# Patient Record
Sex: Female | Born: 1995 | Race: White | Hispanic: No | Marital: Single | Smoking: Never smoker
Health system: Southern US, Community
[De-identification: ages and names within clinical notes are randomized; demographics above are authoritative.]

---
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
## 2014-01-26 ENCOUNTER — Encounter: Payer: Self-pay | Admitting: Internal Medicine

## 2014-03-10 ENCOUNTER — Ambulatory Visit (INDEPENDENT_AMBULATORY_CARE_PROVIDER_SITE_OTHER): Payer: 59 | Admitting: Internal Medicine

## 2014-03-10 ENCOUNTER — Encounter: Payer: Self-pay | Admitting: Internal Medicine

## 2014-03-10 VITALS — BP 110/70 | HR 74 | Temp 98.4°F | Ht 61.0 in | Wt 112.5 lb

## 2014-03-10 DIAGNOSIS — N920 Excessive and frequent menstruation with regular cycle: Secondary | ICD-10-CM

## 2014-03-10 DIAGNOSIS — H9190 Unspecified hearing loss, unspecified ear: Secondary | ICD-10-CM

## 2014-03-10 DIAGNOSIS — Z0000 Encounter for general adult medical examination without abnormal findings: Secondary | ICD-10-CM

## 2014-03-10 LAB — POCT URINE PREGNANCY: Preg Test, Ur: NEGATIVE

## 2014-03-10 MED ORDER — LEVONORGEST-ETH ESTRAD 91-DAY 0.15-0.03 &0.01 MG PO TABS: 1.0000 | ORAL_TABLET | Freq: Every day | ORAL | Status: DC

## 2014-03-10 NOTE — Assessment & Plan Note (Signed)
General medical exam normal today including breast exam. PAP and pelvic deferred as pt asymptomatic, plan for first PAP at age 18. Labs today including CBC, CMP, lipids. Immunizations are UTD except flu vaccine which she will have this fall. Encouraged healthy diet and exercise.

## 2014-03-10 NOTE — Assessment & Plan Note (Signed)
Exam normal today, however pt notes hearing difficulty with some voices. Will set up audiology evaluation.

## 2014-03-10 NOTE — Assessment & Plan Note (Signed)
Discussed options to help with heavy menstrual bleeding. Will start OCP. Discussed potential risks and benefits with this. Follo wup prn.

## 2014-03-10 NOTE — Progress Notes (Signed)
Subjective:    Patient ID: Kathryn MoorsRenee Rhodes, female    DOB: 02/14/1996, 18 y.o.   MRN: [id number]  HPI 18YO female presents for annual exam.  Attended college last year, however decided not to go back this year. Taking time to reconsider major. Working - [employer] Exercising by walking, not as regularly as before. Diet - occasionally skips breakfast, regular diet with fruit and veggies  Hearing - sometimes has trouble understanding what people are saying Vision - wears contacts, sees Dr. Senaida Oresichardson  Menses - lately more painful, more cramping. Heavy bleeding compared to previous. No clotting noted. Period lasts 4-5 days. Uses tampon or pad more than every 2hr.   Review of Systems  Constitutional: Negative for fever, chills, appetite change, fatigue and unexpected weight change.  HENT: Positive for hearing loss. Negative for congestion and trouble swallowing.   Eyes: Negative for visual disturbance.  Respiratory: Negative for shortness of breath.   Cardiovascular: Negative for chest pain and leg swelling.  Gastrointestinal: Negative for nausea, vomiting, abdominal pain, diarrhea and constipation.  Genitourinary: Positive for menstrual problem. Negative for urgency, vaginal bleeding, vaginal discharge, vaginal pain and pelvic pain.  Musculoskeletal: Negative for arthralgias and myalgias.  Skin: Negative for color change and rash.  Hematological: Negative for adenopathy. Does not bruise/bleed easily.  Psychiatric/Behavioral: Negative for dysphoric mood. The patient is not nervous/anxious.        Objective:    BP 110/70  Pulse 74  Temp(Src) 98.4 F (36.9 C) (Oral)  Ht 5\' 1"  (1.549 m)  Wt 112 lb 8 oz (51.03 kg)  BMI 21.27 kg/m2  SpO2 99%  LMP 02/22/2014 Physical Exam  Constitutional: She is oriented to person, place, and time. She appears well-developed and well-nourished. No distress.  HENT:  Head: Normocephalic and atraumatic.  Right Ear: External ear normal.  Left Ear:  External ear normal.  Nose: Nose normal.  Mouth/Throat: Oropharynx is clear and moist. No oropharyngeal exudate.  Eyes: Conjunctivae are normal. Pupils are equal, round, and reactive to light. Right eye exhibits no discharge. Left eye exhibits no discharge. No scleral icterus.  Neck: Normal range of motion. Neck supple. No tracheal deviation present. No thyromegaly present.  Cardiovascular: Normal rate, regular rhythm, normal heart sounds and intact distal pulses.  Exam reveals no gallop and no friction rub.   No murmur heard. Pulmonary/Chest: Effort normal and breath sounds normal. No accessory muscle usage. Not tachypneic. No respiratory distress. She has no decreased breath sounds. She has no wheezes. She has no rales. She exhibits no tenderness. Right breast exhibits no inverted nipple, no mass, no nipple discharge, no skin change and no tenderness. Left breast exhibits no inverted nipple, no mass, no nipple discharge, no skin change and no tenderness. Breasts are symmetrical.  Abdominal: Soft. Bowel sounds are normal. She exhibits no distension and no mass. There is no tenderness. There is no rebound and no guarding.  Musculoskeletal: Normal range of motion. She exhibits no edema and no tenderness.  Lymphadenopathy:    She has no cervical adenopathy.  Neurological: She is alert and oriented to person, place, and time. No cranial nerve deficit. She exhibits normal muscle tone. Coordination normal.  Skin: Skin is warm and dry. No rash noted. She is not diaphoretic. No erythema. No pallor.  Psychiatric: She has a normal mood and affect. Her behavior is normal. Judgment and thought content normal.          Assessment & Plan:   Problem List Items Addressed This Visit  Unprioritized   Hearing decreased     Exam normal today, however pt notes hearing difficulty with some voices. Will set up audiology evaluation.    Relevant Orders      Ambulatory referral to Audiology   Menorrhagia  with regular cycle     Discussed options to help with heavy menstrual bleeding. Will start OCP. Discussed potential risks and benefits with this. Follo wup prn.    Relevant Medications      Levonorgestrel-Ethinyl Estradiol (AMETHIA,CAMRESE) 0.15-0.03 &0.01 MG tablet   Routine general medical examination at a health care facility - Primary     General medical exam normal today including breast exam. PAP and pelvic deferred as pt asymptomatic, plan for first PAP at age 71. Labs today including CBC, CMP, lipids. Immunizations are UTD except flu vaccine which she will have this fall. Encouraged healthy diet and exercise.    Relevant Orders      POCT urine pregnancy      CBC with Differential      Comprehensive metabolic panel      Lipid panel       Return in about 1 year (around 03/11/2015) for Physical.

## 2014-03-10 NOTE — Patient Instructions (Signed)

## 2014-03-11 LAB — COMPREHENSIVE METABOLIC PANEL
ALBUMIN: 4.4 g/dL (ref 3.5–5.2)
ALT: 11 U/L (ref 0–35)
AST: 24 U/L (ref 0–37)
Alkaline Phosphatase: 80 U/L (ref 47–119)
BUN: 9 mg/dL (ref 6–23)
CALCIUM: 9.5 mg/dL (ref 8.4–10.5)
CHLORIDE: 104 meq/L (ref 96–112)
CO2: 27 mEq/L (ref 19–32)
Creatinine, Ser: 0.7 mg/dL (ref 0.4–1.2)
GFR: 124.74 mL/min (ref >60.00)
Glucose, Bld: 69 mg/dL — ABNORMAL LOW (ref 70–99)
POTASSIUM: 4.1 meq/L (ref 3.5–5.1)
Sodium: 138 mEq/L (ref 135–145)
TOTAL PROTEIN: 7 g/dL (ref 6.0–8.3)
Total Bilirubin: 0.5 mg/dL (ref 0.2–1.2)

## 2014-03-11 LAB — LIPID PANEL
CHOL/HDL RATIO: 3
Cholesterol: 159 mg/dL (ref 0–200)
HDL: 61.9 mg/dL (ref >39.00)
LDL Cholesterol: 84 mg/dL (ref 0–99)
NONHDL: 97.1
Triglycerides: 66 mg/dL (ref 0.0–149.0)
VLDL: 13.2 mg/dL (ref 0.0–40.0)

## 2014-03-11 LAB — CBC WITH DIFFERENTIAL/PLATELET
BASOS ABS: 0 10*3/uL (ref 0.0–0.1)
BASOS PCT: 0.3 % (ref 0.0–3.0)
EOS ABS: 0.1 10*3/uL (ref 0.0–0.7)
Eosinophils Relative: 1.6 % (ref 0.0–5.0)
HCT: 39 % (ref 36.0–49.0)
Hemoglobin: 13.2 g/dL (ref 12.0–16.0)
LYMPHS PCT: 33.1 % (ref 24.0–48.0)
Lymphs Abs: 1.6 10*3/uL (ref 0.7–4.0)
MCHC: 33.7 g/dL (ref 31.0–37.0)
MCV: 98 fl (ref 78.0–98.0)
Monocytes Absolute: 0.4 10*3/uL (ref 0.1–1.0)
Monocytes Relative: 7.2 % (ref 3.0–12.0)
NEUTROS PCT: 57.8 % (ref 43.0–71.0)
Neutro Abs: 2.8 10*3/uL (ref 1.4–7.7)
Platelets: 289 10*3/uL (ref 150.0–575.0)
RBC: 3.98 Mil/uL (ref 3.80–5.70)
RDW: 12.4 % (ref 11.4–15.5)
WBC: 4.9 10*3/uL (ref 4.5–13.5)

## 2014-03-13 ENCOUNTER — Encounter: Payer: Self-pay | Admitting: *Deleted

## 2014-09-04 ENCOUNTER — Encounter (INDEPENDENT_AMBULATORY_CARE_PROVIDER_SITE_OTHER): Payer: Self-pay

## 2014-09-04 ENCOUNTER — Encounter: Payer: Self-pay | Admitting: Nurse Practitioner

## 2014-09-04 ENCOUNTER — Ambulatory Visit (INDEPENDENT_AMBULATORY_CARE_PROVIDER_SITE_OTHER): Payer: Managed Care, Other (non HMO) | Admitting: Nurse Practitioner

## 2014-09-04 VITALS — BP 108/78 | HR 77 | Temp 98.1°F | Resp 12 | Ht 61.0 in | Wt 112.8 lb

## 2014-09-04 DIAGNOSIS — K219 Gastro-esophageal reflux disease without esophagitis: Secondary | ICD-10-CM | POA: Insufficient documentation

## 2014-09-04 DIAGNOSIS — N920 Excessive and frequent menstruation with regular cycle: Secondary | ICD-10-CM

## 2014-09-04 MED ORDER — OMEPRAZOLE 20 MG PO CPDR: 20.0000 mg | DELAYED_RELEASE_CAPSULE | Freq: Every day | ORAL | Status: DC

## 2014-09-04 MED ORDER — LEVONORGEST-ETH ESTRAD 91-DAY 0.15-0.03 &0.01 MG PO TABS: 1.0000 | ORAL_TABLET | Freq: Every day | ORAL | Status: DC

## 2014-09-04 NOTE — Progress Notes (Signed)
Pre visit review using our clinic review tool, if applicable. No additional management support is needed unless otherwise documented below in the visit note. 

## 2014-09-04 NOTE — Patient Instructions (Addendum)
Omeprazole; Sodium Bicarbonate oral capsule What is this medicine? OMEPRAZOLE; SODIUM BICARBONATE (oh ME pray zol; SOE dee um bye KAR bon ate) prevents the production of acid in the stomach. It is used to treat gastroesophageal reflux disease (GERD), ulcers, and inflammation of the esophagus. This medicine may be used for other purposes; ask your health care provider or pharmacist if you have questions. COMMON BRAND NAME(S): Zegerid What should I tell my health care provider before I take this medicine? They need to know if you have any of these conditions: -Bartter's syndrome -kidney disease -history of low levels of calcium, magnesium, or potassium in the blood -low salt diet -metabolic alkalosis -an unusual reaction to omeprazole, sodium bicarbonate, other medicines, foods, dyes, or preservatives -pregnant or trying to get pregnant -breast-feeding How should I use this medicine? Take this medicine by mouth with a glass of water. Do not take with other drinks or foods. Follow the directions on the prescription label. Do not cut, crush or chew the capsules. Take this medicine on an empty stomach, at least 1 hour before a meal. Take your medicine at regular intervals. Do not take your medicine more often than directed. Do not stop taking except on your doctor's advice. Talk to your pediatrician regarding the use of this medicine in children. Special care may be needed. Overdosage: If you think you have taken too much of this medicine contact a poison control center or emergency room at once. NOTE: This medicine is only for you. Do not share this medicine with others. What if I miss a dose? If you miss a dose, take it as soon as you can. If it is almost time for your next dose, take only that dose. Do not take double or extra doses. What may interact with this medicine? Do not take this medicine with any of the following medications: -atazanavir -clopidogrel -nelfinavir This medicine may  also interact with the following medications: -antibiotics like ampicillin, clarithromycin, tetracycline -antifungals like itraconazole, ketoconazole, and voriconazole -antivirals like delavirdine, efavirenz, indinavir, saquinavir -certain medicines that treat or prevent blood clots like warfarin -cyclosporine -dasatinib -digoxin -disulfiram -diuretics -iron supplements -medicines for anxiety, panic, and sleep like diazepam -medicines for seizures like carbamazepine, phenobarbital, phenytoin -other medicines for stomach acid -tacrolimus -tolmetin -vitamin B12 This list may not describe all possible interactions. Give your health care provider a list of all the medicines, herbs, non-prescription drugs, or dietary supplements you use. Also tell them if you smoke, drink alcohol, or use illegal drugs. Some items may interact with your medicine. What should I watch for while using this medicine? Visit your doctor for regular check ups. Tell your doctor if your symptoms do not get better or if they get worse. Do not treat diarrhea with over the counter products. Contact your doctor if you have diarrhea that lasts more than 2 days or if it is severe and watery. You may need blood work done while you are taking this medicine. What side effects may I notice from receiving this medicine? Side effects that you should report to your doctor or health care professional as soon as possible: -allergic reactions like skin rash, itching or hives, swelling of the face, lips, or tongue -bone, muscle or joint pain -breathing problems -chest pain -dark urine -diarrhea -dizziness -fast, irregular heartbeat -feeling faint or lightheaded, falls -fever or sore throat -high blood pressure -palpitations -muscle spasms -redness, blistering, peeling or loosening of the skin, including inside the mouth -seizures -stomach pain -swelling of the ankles,  legs -tremors -trouble passing urine or change in the  amount of urine -unusual bleeding, bruising -unusually weak or tired -yellowing of the eyes or skin Side effects that usually do not require medical attention (report to your doctor or health care professional if they continue or are bothersome): -constipation -dry mouth -headache -loose stools -nausea This list may not describe all possible side effects. Call your doctor for medical advice about side effects. You may report side effects to FDA at 1-800-FDA-1088. Where should I keep my medicine? Keep out of the reach of children. Store at room temperature between 15 and 30 degrees C (59 and 86 degrees F). Protect from moisture. Throw away any unused medicine after the expiration date. NOTE: This sheet is a summary. It may not cover all possible information. If you have questions about this medicine, talk to your doctor, pharmacist, or health care provider.  2015, [employer]vier/[employer] Standard. (2011-04-14 14:37:02)

## 2014-09-04 NOTE — Assessment & Plan Note (Signed)
Pt stable and happy with OCP. Not sexually active and currently menstruating. Do not need POCT urine pregnancy today due to consistency and inactivity. Will refill. Sees Dr. Dan HumphreysWalker again in August.

## 2014-09-04 NOTE — Assessment & Plan Note (Signed)
Suspect GERD. Worsening. Dyspepsia can occur with Prozac. Follow up if worsening/failure to improve.

## 2014-09-04 NOTE — Progress Notes (Signed)
   Subjective:    Patient ID: Kathryn MoorsRenee Rhodes, female    DOB: 04/21/1996, 19 y.o.   MRN: [id number]  HPI  Kathryn Rhodes is a 19 yo female with a CC of burning when eating and drinking.  1) After eating breakfast this morning felt pain/burning. Felt it before a few times and it was more discomfort than pain. Pt points to epigastric area. Lasted for about 2 hours and resolved gradually. Drinks tea and there was no pain after this today.   No spicy foods, no smoking, drinks tea caffeine- hot tea, likes chocolate, no recent NSAID use, no use of peppermint recently.   Changed medications recently added trazadone and prozac about 4 weeks ago from Dr. Maryruth BunKapur. Has not had the adderall yet  2) LMP- 08/01/14 lasts about 3-4 days, OCP no side effects reported, takes every day about the same time, Not currently sexually active.   Review of Systems  Constitutional: Negative for fever, chills, diaphoresis and fatigue.  Respiratory: Negative for chest tightness, shortness of breath and wheezing.   Cardiovascular: Negative for chest pain, palpitations and leg swelling.  Gastrointestinal: Positive for abdominal pain. Negative for nausea, vomiting, diarrhea and abdominal distention.       Epigastric burning after breakfast this morning  Genitourinary: Negative for menstrual problem.       Menstrual issue controlled with current OCP  Skin: Negative for rash.  Neurological: Negative for dizziness, weakness, numbness and headaches.  Psychiatric/Behavioral: The patient is not nervous/anxious.        Objective:   Physical Exam  Constitutional: She is oriented to person, place, and time. She appears well-developed and well-nourished. No distress.  BP 108/78 mmHg  Pulse 77  Temp(Src) 98.1 F (36.7 C) (Oral)  Resp 12  Ht 5\' 1"  (1.549 m)  Wt 112 lb 12.8 oz (51.166 kg)  BMI 21.32 kg/m2  SpO2 98%  LMP  (Approximate)   HENT:  Head: Normocephalic and atraumatic.  Right Ear: External ear normal.  Left Ear:  External ear normal.  Cardiovascular: Normal rate, regular rhythm, normal heart sounds and intact distal pulses.  Exam reveals no gallop and no friction rub.   No murmur heard. Pulmonary/Chest: Effort normal and breath sounds normal. No respiratory distress. She has no wheezes. She has no rales. She exhibits no tenderness.  Abdominal: Soft. Bowel sounds are normal. She exhibits no distension and no mass. There is no tenderness. There is no rebound and no guarding.  Neurological: She is alert and oriented to person, place, and time. No cranial nerve deficit. She exhibits normal muscle tone. Coordination normal.  Skin: Skin is warm and dry. No rash noted. She is not diaphoretic.  Psychiatric: She has a normal mood and affect. Her behavior is normal. Judgment and thought content normal.        Assessment & Plan:

## 2015-03-13 ENCOUNTER — Telehealth: Payer: Self-pay | Admitting: Internal Medicine

## 2015-03-13 ENCOUNTER — Encounter: Payer: Self-pay | Admitting: Internal Medicine

## 2015-03-13 DIAGNOSIS — Z0289 Encounter for other administrative examinations: Secondary | ICD-10-CM

## 2015-03-13 NOTE — Telephone Encounter (Signed)
Pt missed appt for this morning. Pt missed appt due to class. I confirmed appt with pt yesterday but wanted to come earlier no appt was avail, pt stated ok and hung up. Thank You!

## 2015-03-28 ENCOUNTER — Encounter: Payer: Self-pay | Admitting: Internal Medicine

## 2015-03-28 ENCOUNTER — Ambulatory Visit (INDEPENDENT_AMBULATORY_CARE_PROVIDER_SITE_OTHER): Payer: Managed Care, Other (non HMO) | Admitting: Internal Medicine

## 2015-03-28 VITALS — BP 100/70 | HR 102 | Temp 98.6°F | Ht 60.75 in | Wt 101.1 lb

## 2015-03-28 DIAGNOSIS — Z0000 Encounter for general adult medical examination without abnormal findings: Secondary | ICD-10-CM | POA: Diagnosis not present

## 2015-03-28 DIAGNOSIS — N920 Excessive and frequent menstruation with regular cycle: Secondary | ICD-10-CM

## 2015-03-28 MED ORDER — LEVONORGEST-ETH ESTRAD 91-DAY 0.15-0.03 &0.01 MG PO TABS: 1.0000 | ORAL_TABLET | Freq: Every day | ORAL | Status: DC

## 2015-03-28 NOTE — Progress Notes (Signed)
Subjective:    Patient ID: Kathryn Rhodes, female    DOB: 1995-08-19, 19 y.o.   MRN: [id number]  HPI  19YO female presents for physical exam.  Doing well. Menstrual cycles improved with OCP. Followed by Dr. Maryruth Bun and on Fluoxetine and Adderall. Also has counseling with Dr. Guss Bunde. Feels that medication has been helpful.  History reviewed. No pertinent past medical history. Family History  Problem Relation Age of Onset  . Mental retardation Father   . Cancer Paternal Aunt     lung, great aunt  . Arthritis Maternal Grandmother   . Heart disease Maternal Grandmother   . Diabetes Maternal Grandmother   . Arthritis Maternal Grandfather   . Heart disease Maternal Grandfather   . Diabetes Maternal Grandfather   . Arthritis Paternal Grandmother   . Heart disease Paternal Grandmother   . Diabetes Paternal Grandmother   . Arthritis Paternal Grandfather   . Heart disease Paternal Grandfather   . Diabetes Paternal Grandfather    History reviewed. No pertinent past surgical history. Social History   Social History  . Marital Status: Single    Spouse Name: N/A  . Number of Children: N/A  . Years of Education: N/A   Social History Main Topics  . Smoking status: Never Smoker   . Smokeless tobacco: Never Used  . Alcohol Use: No  . Drug Use: No  . Sexual Activity: Not Asked   Other Topics Concern  . None   Social History Narrative   Lives in [place] with family. Older brother. No pets.      Previously lived in [place], [place] and [place].      College- [employer]      Diet - regular      Exercise - walks daily 30-21min, bikes    Review of Systems  Constitutional: Negative for fever, chills, appetite change, fatigue and unexpected weight change.  Eyes: Negative for visual disturbance.  Respiratory: Negative for shortness of breath.   Cardiovascular: Negative for chest pain and leg swelling.  Gastrointestinal: Negative for nausea, vomiting, abdominal pain, diarrhea and  constipation.  Musculoskeletal: Negative for myalgias and arthralgias.  Skin: Negative for color change and rash.  Hematological: Negative for adenopathy. Does not bruise/bleed easily.  Psychiatric/Behavioral: Negative for sleep disturbance and dysphoric mood. The patient is not nervous/anxious.        Objective:    BP 100/70 mmHg  Pulse 102  Temp(Src) 98.6 F (37 C) (Oral)  Ht 5' 0.75" (1.543 m)  Wt 101 lb 2 oz (45.87 kg)  BMI 19.27 kg/m2  SpO2 99%  LMP 02/28/2015 (Approximate) Physical Exam  Constitutional: She is oriented to person, place, and time. She appears well-developed and well-nourished. No distress.  HENT:  Head: Normocephalic and atraumatic.  Right Ear: External ear normal.  Left Ear: External ear normal.  Nose: Nose normal.  Mouth/Throat: Oropharynx is clear and moist. No oropharyngeal exudate.  Eyes: Conjunctivae and EOM are normal. Pupils are equal, round, and reactive to light. Right eye exhibits no discharge. Left eye exhibits no discharge. No scleral icterus.  Neck: Normal range of motion. Neck supple. No tracheal deviation present. No thyromegaly present.  Cardiovascular: Normal rate, regular rhythm, normal heart sounds and intact distal pulses.  Exam reveals no gallop and no friction rub.   No murmur heard. Pulmonary/Chest: Effort normal and breath sounds normal. No respiratory distress. She has no wheezes. She has no rales. She exhibits no tenderness.  Abdominal: Soft. Bowel sounds are normal. She exhibits no distension  and no mass. There is no tenderness. There is no rebound and no guarding.  Musculoskeletal: Normal range of motion. She exhibits no edema or tenderness.  Lymphadenopathy:    She has no cervical adenopathy.  Neurological: She is alert and oriented to person, place, and time. No cranial nerve deficit. She exhibits normal muscle tone. Coordination normal.  Skin: Skin is warm and dry. No rash noted. She is not diaphoretic. No erythema. No pallor.   Psychiatric: She has a normal mood and affect. Her behavior is normal. Judgment and thought content normal.          Assessment & Plan:   Problem List Items Addressed This Visit      Unprioritized   Routine general medical examination at a health care facility - Primary    General medical exam normal today. Encouraged healthy diet and exercise. Discussed preventative health and safety measures, such as avoiding texting while driving, avoiding excessive alcohol consumption, barrier protection during intercourse. Labs were normal in 2015, and will wait to repeat until next year. Discussed urine screen for chlamydia if she becomes sexually active. PAP smear for cervical cancer at age 71. Flu vaccine today.       Other Visit Diagnoses    Menorrhagia with regular cycle        Relevant Medications    Levonorgestrel-Ethinyl Estradiol (AMETHIA,CAMRESE) 0.15-0.03 &0.01 MG tablet        Return in about 1 year (around 03/27/2016) for Physical.

## 2015-03-28 NOTE — Assessment & Plan Note (Signed)
General medical exam normal today. Encouraged healthy diet and exercise. Discussed preventative health and safety measures, such as avoiding texting while driving, avoiding excessive alcohol consumption, barrier protection during intercourse. Labs were normal in 2015, and will wait to repeat until next year. Discussed urine screen for chlamydia if she becomes sexually active. PAP smear for cervical cancer at age 19. Flu vaccine today.

## 2015-03-28 NOTE — Patient Instructions (Signed)

## 2015-03-28 NOTE — Progress Notes (Signed)
Pre visit review using our clinic review tool, if applicable. No additional management support is needed unless otherwise documented below in the visit note. 

## 2015-08-27 ENCOUNTER — Encounter: Payer: Self-pay | Admitting: Internal Medicine

## 2016-02-12 ENCOUNTER — Encounter: Payer: Managed Care, Other (non HMO) | Admitting: Family

## 2016-02-12 ENCOUNTER — Encounter: Payer: Self-pay | Admitting: Family

## 2016-02-12 NOTE — Progress Notes (Signed)
Pre visit review using our clinic review tool, if applicable. No additional management support is needed unless otherwise documented below in the visit note. 

## 2016-02-12 NOTE — Progress Notes (Signed)
   Subjective:    Patient ID: Kathryn Rhodes, female    DOB: 1996/05/08, 20 y.o.   MRN: [id number]  CC: [name] is a 20 y.o. female who presents today for physical exam and to establish care.    HPI: Patient here for physical exam. No complaints. Feeling well.    Follows with for ADHD.  Sexually active LMP:     HISTORY:  No past medical history on file.  No past surgical history on file. Family History  Problem Relation Age of Onset  . Mental retardation Father   . Cancer Paternal Aunt     lung, great aunt  . Arthritis Maternal Grandmother   . Heart disease Maternal Grandmother   . Diabetes Maternal Grandmother   . Arthritis Maternal Grandfather   . Heart disease Maternal Grandfather   . Diabetes Maternal Grandfather   . Arthritis Paternal Grandmother   . Heart disease Paternal Grandmother   . Diabetes Paternal Grandmother   . Arthritis Paternal Grandfather   . Heart disease Paternal Grandfather   . Diabetes Paternal Grandfather       ALLERGIES: Review of patient's allergies indicates no known allergies.  Current Outpatient Prescriptions on File Prior to Visit  Medication Sig Dispense Refill  . amphetamine-dextroamphetamine (ADDERALL) 5 MG tablet Take 5 mg by mouth 2 (two) times daily.    Marland Kitchen FLUoxetine (PROZAC) 10 MG capsule Take 30 mg by mouth daily.    . Levonorgestrel-Ethinyl Estradiol (AMETHIA,CAMRESE) 0.15-0.03 &0.01 MG tablet Take 1 tablet by mouth daily. 1 Package 4   No current facility-administered medications on file prior to visit.     Social History  Substance Use Topics  . Smoking status: Never Smoker  . Smokeless tobacco: Never Used  . Alcohol use No    Review of Systems    Objective:    There were no vitals taken for this visit.  BP Readings from Last 3 Encounters:  03/28/15 100/70  09/04/14 108/78  03/10/14 110/70   Wt Readings from Last 3 Encounters:  03/28/15 101 lb 2 oz (45.9 kg)  09/04/14 112 lb 12.8 oz (51.2 kg) (20 %, Z=  -0.82)*  03/10/14 112 lb 8 oz (51 kg) (21 %, Z= -0.79)*   * Growth percentiles are based on CDC 2-20 Years data.    Physical Exam     Assessment & Plan:   Problem List Items Addressed This Visit    None    Visit Diagnoses   None.      I am having Ms. Decker maintain her amphetamine-dextroamphetamine, FLUoxetine, and Levonorgestrel-Ethinyl Estradiol.   No orders of the defined types were placed in this encounter.   Return precautions given.   Risks, benefits, and alternatives of the medications and treatment plan prescribed today were discussed, and patient expressed understanding.   Education regarding symptom management and diagnosis given to patient on AVS.   Continue to follow with Wynona Dove, MD for routine health maintenance.   Kerby Moors and I agreed with plan.   Rennie Plowman, FNP     This encounter was created in error - please disregard.

## 2016-02-27 ENCOUNTER — Encounter: Payer: Self-pay | Admitting: Family

## 2016-02-27 ENCOUNTER — Ambulatory Visit (INDEPENDENT_AMBULATORY_CARE_PROVIDER_SITE_OTHER): Payer: Managed Care, Other (non HMO) | Admitting: Family

## 2016-02-27 VITALS — BP 114/64 | HR 104 | Temp 98.1°F | Ht 61.0 in | Wt 112.4 lb

## 2016-02-27 DIAGNOSIS — Z0000 Encounter for general adult medical examination without abnormal findings: Secondary | ICD-10-CM

## 2016-02-27 DIAGNOSIS — F32A Depression, unspecified: Secondary | ICD-10-CM | POA: Insufficient documentation

## 2016-02-27 DIAGNOSIS — F988 Other specified behavioral and emotional disorders with onset usually occurring in childhood and adolescence: Secondary | ICD-10-CM

## 2016-02-27 DIAGNOSIS — F329 Major depressive disorder, single episode, unspecified: Secondary | ICD-10-CM | POA: Diagnosis not present

## 2016-02-27 DIAGNOSIS — N920 Excessive and frequent menstruation with regular cycle: Secondary | ICD-10-CM

## 2016-02-27 DIAGNOSIS — F909 Attention-deficit hyperactivity disorder, unspecified type: Secondary | ICD-10-CM | POA: Diagnosis not present

## 2016-02-27 MED ORDER — LEVONORGEST-ETH ESTRAD 91-DAY 0.15-0.03 &0.01 MG PO TABS: 1.0000 | ORAL_TABLET | Freq: Every day | ORAL | 4 refills | Status: AC

## 2016-02-27 NOTE — Patient Instructions (Signed)
Pleasure meeting you.   Health Maintenance, Female Adopting a healthy lifestyle and getting preventive care can go a long way to promote health and wellness. Talk with your health care provider about what schedule of regular examinations is right for you. This is a good chance for you to check in with your provider about disease prevention and staying healthy. In between checkups, there are plenty of things you can do on your own. Experts have done a lot of research about which lifestyle changes and preventive measures are most likely to keep you healthy. Ask your health care provider for more information. WEIGHT AND DIET  Eat a healthy diet  Be sure to include plenty of vegetables, fruits, low-fat dairy products, and lean protein.  Do not eat a lot of foods high in solid fats, added sugars, or salt.  Get regular exercise. This is one of the most important things you can do for your health.  Most adults should exercise for at least 150 minutes each week. The exercise should increase your heart rate and make you sweat (moderate-intensity exercise).  Most adults should also do strengthening exercises at least twice a week. This is in addition to the moderate-intensity exercise.  Maintain a healthy weight  Body mass index (BMI) is a measurement that can be used to identify possible weight problems. It estimates body fat based on height and weight. Your health care provider can help determine your BMI and help you achieve or maintain a healthy weight.  For females 20 years of age and older:   A BMI below 18.5 is considered underweight.  A BMI of 18.5 to 24.9 is normal.  A BMI of 25 to 29.9 is considered overweight.  A BMI of 30 and above is considered obese.  Watch levels of cholesterol and blood lipids  You should start having your blood tested for lipids and cholesterol at 20 years of age, then have this test every 5 years.  You may need to have your cholesterol levels checked more  often if:  Your lipid or cholesterol levels are high.  You are older than 21 years of age.  You are at high risk for heart disease.  CANCER SCREENING   Lung Cancer  Lung cancer screening is recommended for adults 55-80 years old who are at high risk for lung cancer because of a history of smoking.  A yearly low-dose CT scan of the lungs is recommended for people who:  Currently smoke.  Have quit within the past 15 years.  Have at least a 30-pack-year history of smoking. A pack year is smoking an average of one pack of cigarettes a day for 1 year.  Yearly screening should continue until it has been 15 years since you quit.  Yearly screening should stop if you develop a health problem that would prevent you from having lung cancer treatment.  Breast Cancer  Practice breast self-awareness. This means understanding how your breasts normally appear and feel.  It also means doing regular breast self-exams. Let your health care provider know about any changes, no matter how small.  If you are in your 20s or 30s, you should have a clinical breast exam (CBE) by a health care provider every 1-3 years as part of a regular health exam.  If you are 40 or older, have a CBE every year. Also consider having a breast X-ray (mammogram) every year.  If you have a family history of breast cancer, talk to your health care provider about   genetic screening.  If you are at high risk for breast cancer, talk to your health care provider about having an MRI and a mammogram every year.  Breast cancer gene (BRCA) assessment is recommended for women who have family members with BRCA-related cancers. BRCA-related cancers include:  Breast.  Ovarian.  Tubal.  Peritoneal cancers.  Results of the assessment will determine the need for genetic counseling and BRCA1 and BRCA2 testing. Cervical Cancer Your health care provider may recommend that you be screened regularly for cancer of the pelvic organs  (ovaries, uterus, and vagina). This screening involves a pelvic examination, including checking for microscopic changes to the surface of your cervix (Pap test). You may be encouraged to have this screening done every 3 years, beginning at age 21.  For women ages 30-65, health care providers may recommend pelvic exams and Pap testing every 3 years, or they may recommend the Pap and pelvic exam, combined with testing for human papilloma virus (HPV), every 5 years. Some types of HPV increase your risk of cervical cancer. Testing for HPV may also be done on women of any age with unclear Pap test results.  Other health care providers may not recommend any screening for nonpregnant women who are considered low risk for pelvic cancer and who do not have symptoms. Ask your health care provider if a screening pelvic exam is right for you.  If you have had past treatment for cervical cancer or a condition that could lead to cancer, you need Pap tests and screening for cancer for at least 20 years after your treatment. If Pap tests have been discontinued, your risk factors (such as having a new sexual partner) need to be reassessed to determine if screening should resume. Some women have medical problems that increase the chance of getting cervical cancer. In these cases, your health care provider may recommend more frequent screening and Pap tests. Colorectal Cancer  This type of cancer can be detected and often prevented.  Routine colorectal cancer screening usually begins at 21 years of age and continues through 21 years of age.  Your health care provider may recommend screening at an earlier age if you have risk factors for colon cancer.  Your health care provider may also recommend using home test kits to check for hidden blood in the stool.  A small camera at the end of a tube can be used to examine your colon directly (sigmoidoscopy or colonoscopy). This is done to check for the earliest forms of  colorectal cancer.  Routine screening usually begins at age 50.  Direct examination of the colon should be repeated every 5-10 years through 21 years of age. However, you may need to be screened more often if early forms of precancerous polyps or small growths are found. Skin Cancer  Check your skin from head to toe regularly.  Tell your health care provider about any new moles or changes in moles, especially if there is a change in a mole's shape or color.  Also tell your health care provider if you have a mole that is larger than the size of a pencil eraser.  Always use sunscreen. Apply sunscreen liberally and repeatedly throughout the day.  Protect yourself by wearing long sleeves, pants, a wide-brimmed hat, and sunglasses whenever you are outside. HEART DISEASE, DIABETES, AND HIGH BLOOD PRESSURE   High blood pressure causes heart disease and increases the risk of stroke. High blood pressure is more likely to develop in:  People who have blood   pressure in the high end of the normal range (130-139/85-89 mm Hg).  People who are overweight or obese.  People who are African American.  If you are 27-24 years of age, have your blood pressure checked every 3-5 years. If you are 57 years of age or older, have your blood pressure checked every year. You should have your blood pressure measured twice--once when you are at a hospital or clinic, and once when you are not at a hospital or clinic. Record the average of the two measurements. To check your blood pressure when you are not at a hospital or clinic, you can use:  An automated blood pressure machine at a pharmacy.  A home blood pressure monitor.  If you are between 45 years and 18 years old, ask your health care provider if you should take aspirin to prevent strokes.  Have regular diabetes screenings. This involves taking a blood sample to check your fasting blood sugar level.  If you are at a normal weight and have a low risk  for diabetes, have this test once every three years after 21 years of age.  If you are overweight and have a high risk for diabetes, consider being tested at a younger age or more often. PREVENTING INFECTION  Hepatitis B  If you have a higher risk for hepatitis B, you should be screened for this virus. You are considered at high risk for hepatitis B if:  You were born in a country where hepatitis B is common. Ask your health care provider which countries are considered high risk.  Your parents were born in a high-risk country, and you have not been immunized against hepatitis B (hepatitis B vaccine).  You have HIV or AIDS.  You use needles to inject street drugs.  You live with someone who has hepatitis B.  You have had sex with someone who has hepatitis B.  You get hemodialysis treatment.  You take certain medicines for conditions, including cancer, organ transplantation, and autoimmune conditions. Hepatitis C  Blood testing is recommended for:  Everyone born from [birth year] through 1966.  Anyone with known risk factors for hepatitis C. Sexually transmitted infections (STIs)  You should be screened for sexually transmitted infections (STIs) including gonorrhea and chlamydia if:  You are sexually active and are younger than 21 years of age.  You are older than 21 years of age and your health care provider tells you that you are at risk for this type of infection.  Your sexual activity has changed since you were last screened and you are at an increased risk for chlamydia or gonorrhea. Ask your health care provider if you are at risk.  If you do not have HIV, but are at risk, it may be recommended that you take a prescription medicine daily to prevent HIV infection. This is called pre-exposure prophylaxis (PrEP). You are considered at risk if:  You are sexually active and do not regularly use condoms or know the HIV status of your partner(s).  You take drugs by injection.  You  are sexually active with a partner who has HIV. Talk with your health care provider about whether you are at high risk of being infected with HIV. If you choose to begin PrEP, you should first be tested for HIV. You should then be tested every 3 months for as long as you are taking PrEP.  PREGNANCY   If you are premenopausal and you may become pregnant, ask your health care provider about preconception  counseling.  If you may become pregnant, take 400 to 800 micrograms (mcg) of folic acid every day.  If you want to prevent pregnancy, talk to your health care provider about birth control (contraception). OSTEOPOROSIS AND MENOPAUSE   Osteoporosis is a disease in which the bones lose minerals and strength with aging. This can result in serious bone fractures. Your risk for osteoporosis can be identified using a bone density scan.  If you are 65 years of age or older, or if you are at risk for osteoporosis and fractures, ask your health care provider if you should be screened.  Ask your health care provider whether you should take a calcium or vitamin D supplement to lower your risk for osteoporosis.  Menopause may have certain physical symptoms and risks.  Hormone replacement therapy may reduce some of these symptoms and risks. Talk to your health care provider about whether hormone replacement therapy is right for you.  HOME CARE INSTRUCTIONS   Schedule regular health, dental, and eye exams.  Stay current with your immunizations.   Do not use any tobacco products including cigarettes, chewing tobacco, or electronic cigarettes.  If you are pregnant, do not drink alcohol.  If you are breastfeeding, limit how much and how often you drink alcohol.  Limit alcohol intake to no more than 1 drink per day for nonpregnant women. One drink equals 12 ounces of beer, 5 ounces of wine, or 1 ounces of hard liquor.  Do not use street drugs.  Do not share needles.  Ask your health care provider for  help if you need support or information about quitting drugs.  Tell your health care provider if you often feel depressed.  Tell your health care provider if you have ever been abused or do not feel safe at home.   This information is not intended to replace advice given to you by your health care provider. Make sure you discuss any questions you have with your health care provider.   Document Released: 01/20/2011 Document Revised: 07/28/2014 Document Reviewed: 06/08/2013 Elsevier Interactive Patient Education 2016 [employer]  

## 2016-02-27 NOTE — Progress Notes (Signed)
Pre visit review using our clinic review tool, if applicable. No additional management support is needed unless otherwise documented below in the visit note. 

## 2016-02-27 NOTE — Assessment & Plan Note (Addendum)
Pap smear done today. Regular screening labs including HIV done today. Up-to-date on immunizations. Patient is not sexually active and currently using her birth control to regulate and manage menstrual cycles. Encouraged regular exercise.

## 2016-02-27 NOTE — Assessment & Plan Note (Signed)
Stable, well controlled on Adderall. Continues to follow with psychiatrist.

## 2016-02-27 NOTE — Addendum Note (Signed)
Addended by: Felix AhmadiFRANSEN, Danyela Posas A on: 02/27/2016 03:41 PM   Modules accepted: Orders

## 2016-02-27 NOTE — Progress Notes (Signed)
Subjective:    Patient ID: Kathryn MoorsRenee Pehrson, female    DOB: 04/24/1996, 20 y.o.   MRN: [id number]  CC: Kathryn MoorsRenee Thelin is a 20 y.o. female who presents today for physical exam.    HPI: Here to establish care and for CPE. Generally feeling well, no complaints.   ADD: Continues to follow Dr. Maryruth BunKapur for ADD, depression every 3 months. Formally diagnosed by Leavy CellaJulie Tabor. Takes Adderall.   Depression: Doing well. Has stopped Prozac. Seeing counselor regularly. No thoughts of hurting herself or anyone else.  Uses OCP to control painful periods. Helps with dysmenorrhea. LMP 7/1. No concern for STDs.      Cervical Cancer Screening: Do today.  Immunizations       Tetanus -UTD  HIV Screening- Candidate for; do today Labs: Screening labs today. Exercise: Gets regular exercise.  Alcohol use: None. Smoking/tobacco use: Nonsmoker.  Regular dental exams: UTD Wears seat belt: Yes.  HISTORY:  History reviewed. No pertinent past medical history.  History reviewed. No pertinent surgical history. Family History  Problem Relation Age of Onset  . Mental retardation Father   . Cancer Paternal Aunt     lung, great aunt  . Arthritis Maternal Grandmother   . Heart disease Maternal Grandmother   . Diabetes Maternal Grandmother   . Arthritis Maternal Grandfather   . Heart disease Maternal Grandfather   . Diabetes Maternal Grandfather   . Arthritis Paternal Grandmother   . Heart disease Paternal Grandmother   . Diabetes Paternal Grandmother   . Arthritis Paternal Grandfather   . Heart disease Paternal Grandfather   . Diabetes Paternal Grandfather       ALLERGIES: Review of patient's allergies indicates no known allergies.  Current Outpatient Prescriptions on File Prior to Visit  Medication Sig Dispense Refill  . amphetamine-dextroamphetamine (ADDERALL) 5 MG tablet Take 5 mg by mouth 2 (two) times daily.    Marland Kitchen. FLUoxetine (PROZAC) 10 MG capsule Take 30 mg by mouth daily.     No current  facility-administered medications on file prior to visit.     Social History  Substance Use Topics  . Smoking status: Never Smoker  . Smokeless tobacco: Never Used  . Alcohol use Yes    Review of Systems  Constitutional: Negative for chills, fever and unexpected weight change.  HENT: Negative for congestion.   Respiratory: Negative for cough.   Cardiovascular: Negative for chest pain, palpitations and leg swelling.  Gastrointestinal: Negative for nausea and vomiting.  Genitourinary: Negative for dysuria, genital sores, vaginal discharge and vaginal pain.  Musculoskeletal: Negative for arthralgias and myalgias.  Skin: Negative for rash.  Neurological: Negative for headaches.  Hematological: Negative for adenopathy.  Psychiatric/Behavioral: Negative for confusion and suicidal ideas.      Objective:    BP 114/64 (BP Location: Left Arm, Patient Position: Sitting, Cuff Size: Normal)   Pulse (!) 104   Temp 98.1 F (36.7 C) (Oral)   Ht 5\' 1"  (1.549 m)   Wt 112 lb 6.4 oz (51 kg)   LMP 01/19/2016   SpO2 99%   BMI 21.24 kg/m   BP Readings from Last 3 Encounters:  02/27/16 114/64  02/12/16 122/64  03/28/15 100/70   Wt Readings from Last 3 Encounters:  02/27/16 112 lb 6.4 oz (51 kg)  02/12/16 111 lb (50.3 kg)  03/28/15 101 lb 2 oz (45.9 kg)    Physical Exam  Constitutional: She appears well-developed and well-nourished.  Eyes: Conjunctivae are normal.  Neck: No thyroid mass and no thyromegaly  present.  Cardiovascular: Normal rate, regular rhythm, normal heart sounds and normal pulses.   Pulmonary/Chest: Effort normal and breath sounds normal. She has no wheezes. She has no rhonchi. She has no rales.  Neurological: She is alert.  Skin: Skin is warm and dry.  Psychiatric: She has a normal mood and affect. Her speech is normal and behavior is normal. Thought content normal.  Vitals reviewed.      Assessment & Plan:   Problem List Items Addressed This Visit      Other    Routine general medical examination at a health care facility - Primary    Pap smear done today. Regular screening labs including HIV done today. Up-to-date on immunizations. Encouraged safe sex and regular exercise.      Relevant Orders   CBC with Differential/Platelet   Comprehensive metabolic panel   Hemoglobin A1c   Lipid panel   TSH   VITAMIN D 25 Hydroxy (Vit-D Deficiency, Fractures)   Cytology - PAP   HIV antibody   ADD (attention deficit disorder)    Stable, well controlled on Adderall. Continues to follow with psychiatrist.      Depression    Well-controlled. Not taking any antidepressant at this time. Continues to see a Veterinary surgeon. Will follow.       Other Visit Diagnoses    Menorrhagia with regular cycle       Relevant Medications   Levonorgestrel-Ethinyl Estradiol (AMETHIA,CAMRESE) 0.15-0.03 &0.01 MG tablet       I am having Ms. Ballengee maintain her amphetamine-dextroamphetamine, FLUoxetine, and Levonorgestrel-Ethinyl Estradiol.   Meds ordered this encounter  Medications  . Levonorgestrel-Ethinyl Estradiol (AMETHIA,CAMRESE) 0.15-0.03 &0.01 MG tablet    Sig: Take 1 tablet by mouth daily.    Dispense:  1 Package    Refill:  4    Order Specific Question:   Supervising Provider    Answer:   Sherlene Shams [[id number]]    Return precautions given.   Risks, benefits, and alternatives of the medications and treatment plan prescribed today were discussed, and patient expressed understanding.   Education regarding symptom management and diagnosis given to patient on AVS.   Continue to follow with Rennie Plowman, FNP for routine health maintenance.   Kathryn Rhodes and I agreed with plan.   Rennie Plowman, FNP

## 2016-02-27 NOTE — Assessment & Plan Note (Signed)
Well-controlled. Not taking any antidepressant at this time. Continues to see a Veterinary surgeoncounselor. Will follow.

## 2016-02-29 ENCOUNTER — Telehealth: Payer: Self-pay

## 2016-02-29 NOTE — Telephone Encounter (Signed)
Spoke to patient.  She agree to have a repap from her physical for FU.  PAtient will call to schedule appointment when patient gets time to do so due to her being busy at the moment.

## 2016-02-29 NOTE — Telephone Encounter (Signed)
-----   Message from Allegra GranaMargaret G Arnett, FNP sent at 02/29/2016  8:31 AM EDT ----- Elvina SidleHey there!  Keep me posted when you talk with patient about coming back in for pap, and STD testing.   Just want to ensure we get her back in :)  Have a great weekend!!!   M

## 2016-03-25 ENCOUNTER — Ambulatory Visit (INDEPENDENT_AMBULATORY_CARE_PROVIDER_SITE_OTHER): Payer: Managed Care, Other (non HMO)

## 2016-03-25 ENCOUNTER — Other Ambulatory Visit: Payer: Self-pay | Admitting: Family

## 2016-03-25 ENCOUNTER — Ambulatory Visit (INDEPENDENT_AMBULATORY_CARE_PROVIDER_SITE_OTHER): Payer: Managed Care, Other (non HMO) | Admitting: Family

## 2016-03-25 ENCOUNTER — Encounter: Payer: Self-pay | Admitting: Family

## 2016-03-25 VITALS — BP 124/72 | HR 87 | Ht 61.0 in | Wt 113.0 lb

## 2016-03-25 DIAGNOSIS — M89319 Hypertrophy of bone, unspecified shoulder: Secondary | ICD-10-CM

## 2016-03-25 DIAGNOSIS — Z0000 Encounter for general adult medical examination without abnormal findings: Secondary | ICD-10-CM

## 2016-03-25 DIAGNOSIS — M898X8 Other specified disorders of bone, other site: Secondary | ICD-10-CM | POA: Diagnosis not present

## 2016-03-25 LAB — COMPREHENSIVE METABOLIC PANEL
ALBUMIN: 4.3 g/dL (ref 3.5–5.2)
ALK PHOS: 57 U/L (ref 39–117)
ALT: 10 U/L (ref 0–35)
AST: 16 U/L (ref 0–37)
BUN: 6 mg/dL (ref 6–23)
CO2: 28 mEq/L (ref 19–32)
CREATININE: 0.76 mg/dL (ref 0.40–1.20)
Calcium: 9.1 mg/dL (ref 8.4–10.5)
Chloride: 105 mEq/L (ref 96–112)
GFR: 102.02 mL/min (ref >60.00)
Glucose, Bld: 83 mg/dL (ref 70–99)
Potassium: 4 mEq/L (ref 3.5–5.1)
SODIUM: 137 meq/L (ref 135–145)
TOTAL PROTEIN: 7.2 g/dL (ref 6.0–8.3)
Total Bilirubin: 0.3 mg/dL (ref 0.2–1.2)

## 2016-03-25 LAB — LIPID PANEL
CHOLESTEROL: 143 mg/dL (ref 0–200)
HDL: 51.6 mg/dL (ref >39.00)
LDL Cholesterol: 78 mg/dL (ref 0–99)
NonHDL: 91.77
TRIGLYCERIDES: 69 mg/dL (ref 0.0–149.0)
Total CHOL/HDL Ratio: 3
VLDL: 13.8 mg/dL (ref 0.0–40.0)

## 2016-03-25 LAB — VITAMIN D 25 HYDROXY (VIT D DEFICIENCY, FRACTURES): VITD: 29.18 ng/mL — AB (ref 30.00–100.00)

## 2016-03-25 LAB — CBC WITH DIFFERENTIAL/PLATELET
BASOS ABS: 0 10*3/uL (ref 0.0–0.1)
Basophils Relative: 0.5 % (ref 0.0–3.0)
EOS PCT: 0.5 % (ref 0.0–5.0)
Eosinophils Absolute: 0 10*3/uL (ref 0.0–0.7)
HEMATOCRIT: 39.6 % (ref 36.0–46.0)
Hemoglobin: 13.7 g/dL (ref 12.0–15.0)
LYMPHS ABS: 1.9 10*3/uL (ref 0.7–4.0)
LYMPHS PCT: 35.5 % (ref 12.0–46.0)
MCHC: 34.5 g/dL (ref 30.0–36.0)
MCV: 98.1 fl (ref 78.0–100.0)
MONOS PCT: 6 % (ref 3.0–12.0)
Monocytes Absolute: 0.3 10*3/uL (ref 0.1–1.0)
NEUTROS PCT: 57.5 % (ref 43.0–77.0)
Neutro Abs: 3.2 10*3/uL (ref 1.4–7.7)
Platelets: 339 10*3/uL (ref 150.0–400.0)
RBC: 4.04 Mil/uL (ref 3.87–5.11)
RDW: 12.9 % (ref 11.5–15.5)
WBC: 5.5 10*3/uL (ref 4.0–10.5)

## 2016-03-25 LAB — HEMOGLOBIN A1C: Hgb A1c MFr Bld: 4.7 % (ref 4.6–6.5)

## 2016-03-25 LAB — TSH: TSH: 1.26 u[IU]/mL (ref 0.35–4.50)

## 2016-03-25 NOTE — Progress Notes (Signed)
Pre visit review using our clinic review tool, if applicable. No additional management support is needed unless otherwise documented below in the visit note. 

## 2016-03-25 NOTE — Patient Instructions (Addendum)
Labs and xray today.   Will call with all results.   Unsure of swelling at this time and will start with xray.  Please come back for flu shot.

## 2016-03-25 NOTE — Progress Notes (Signed)
Subjective:    Patient ID: Kathryn MoorsRenee Shonk, female    DOB: 10/29/1995, 20 y.o.   MRN: [id number]  CC: Kathryn MoorsRenee Guerreiro is a 20 y.o. female who presents today for an acute visit.    HPI: Patient here for chief complaint of nontender 'lump' on right collarbone. She noticed it yesterday. Has no fever, chills, lymph adenopathy, congestion, dysphagia. She states no injury. She does note that when she's anxious she will rub her right collarbone.  Had allergy shots this morning and will do the labs today from physical.       HISTORY:  No past medical history on file. No past surgical history on file. Family History  Problem Relation Age of Onset  . Mental retardation Father   . Cancer Paternal Aunt     lung, great aunt  . Arthritis Maternal Grandmother   . Heart disease Maternal Grandmother   . Diabetes Maternal Grandmother   . Arthritis Maternal Grandfather   . Heart disease Maternal Grandfather   . Diabetes Maternal Grandfather   . Arthritis Paternal Grandmother   . Heart disease Paternal Grandmother   . Diabetes Paternal Grandmother   . Arthritis Paternal Grandfather   . Heart disease Paternal Grandfather   . Diabetes Paternal Grandfather     Allergies: Review of patient's allergies indicates no known allergies. Current Outpatient Prescriptions on File Prior to Visit  Medication Sig Dispense Refill  . amphetamine-dextroamphetamine (ADDERALL) 5 MG tablet Take 5 mg by mouth 2 (two) times daily.    Marland Kitchen. FLUoxetine (PROZAC) 10 MG capsule Take 30 mg by mouth daily.    . Levonorgestrel-Ethinyl Estradiol (AMETHIA,CAMRESE) 0.15-0.03 &0.01 MG tablet Take 1 tablet by mouth daily. 1 Package 4   No current facility-administered medications on file prior to visit.     Social History  Substance Use Topics  . Smoking status: Never Smoker  . Smokeless tobacco: Never Used  . Alcohol use Yes    Review of Systems  Constitutional: Negative for chills and fever.  HENT: Negative for sore throat.     Respiratory: Negative for cough.   Cardiovascular: Negative for chest pain and palpitations.  Gastrointestinal: Negative for nausea and vomiting.  Hematological: Negative for adenopathy.      Objective:    BP 124/72   Pulse 87   Ht 5\' 1"  (1.549 m)   Wt 113 lb (51.3 kg)   LMP 01/19/2016   SpO2 97%   BMI 21.35 kg/m    Physical Exam  Constitutional: She appears well-developed and well-nourished.  Eyes: Conjunctivae are normal.  Neck: No thyroid mass and no thyromegaly present.    Cardiovascular: Normal rate, regular rhythm, normal heart sounds and normal pulses.   Pulmonary/Chest: Effort normal and breath sounds normal. She has no wheezes. She has no rhonchi. She has no rales.  Lymphadenopathy:       Head (right side): No submental, no submandibular, no tonsillar, no preauricular, no posterior auricular and no occipital adenopathy present.       Head (left side): No submental, no submandibular, no tonsillar, no preauricular, no posterior auricular and no occipital adenopathy present.    She has no cervical adenopathy.  Neurological: She is alert.  Skin: Skin is warm and dry.  Psychiatric: She has a normal mood and affect. Her speech is normal and behavior is normal. Thought content normal.  Vitals reviewed.      Assessment & Plan:   1. Clavicle enlargement Etiology is unclear at this time. Pending x-ray to evaluate  for occult fracture and subsequent nodule over bone. If x-ray unrevealing, we'll order soft tissue ultrasound. - DG Clavicle Right; Future   Note, labs were not done at physical one month ago as patient forgot. Patient doing them today. She stated she would make an appointment to repeat Pap smear in one year as our specimen from 02/2016 was damaged and unable to be sent to lab. She will also let us know when she has a break in her allergy shot regimen so that we can administer flu shot.    I am having Ms. Savarino maintain her amphetamine-dextroamphetamine,  FLUoxetine, and Levonorgestrel-Ethinyl Estradiol.   No orders of the defined types were placed in this encounter.   Return precautions given.   Risks, benefits, and alternatives of the medications and treatment plan prescribed today were discussed, and patient expressed understanding.   Education regarding symptom management and diagnosis given to patient on AVS.  Continue to follow with Rennie Plowman, FNP for routine health maintenance.   Kathryn Rhodes and I agreed with plan.   Rennie Plowman, FNP

## 2016-03-26 ENCOUNTER — Telehealth: Payer: Self-pay | Admitting: *Deleted

## 2016-03-26 LAB — HIV ANTIBODY (ROUTINE TESTING W REFLEX): HIV: NONREACTIVE

## 2016-03-26 NOTE — Telephone Encounter (Signed)
Patient was informed of results.  Patient understood and no questions, comments, or concerns at this time.  

## 2016-03-26 NOTE — Telephone Encounter (Signed)
Patient has requested Xray results  Pt contact [phone]

## 2016-03-27 ENCOUNTER — Telehealth: Payer: Self-pay

## 2016-03-27 DIAGNOSIS — M89319 Hypertrophy of bone, unspecified shoulder: Secondary | ICD-10-CM

## 2016-03-27 NOTE — Telephone Encounter (Signed)
Ordered per request from previous note.

## 2016-03-28 ENCOUNTER — Ambulatory Visit: Payer: Self-pay | Admitting: Internal Medicine

## 2016-04-02 ENCOUNTER — Ambulatory Visit
Admission: RE | Admit: 2016-04-02 | Discharge: 2016-04-02 | Disposition: A | Discharge status: Home / Self Care | Payer: Managed Care, Other (non HMO) | Source: Ambulatory Visit | Attending: Family | Admitting: Family

## 2016-04-02 ENCOUNTER — Encounter: Payer: Self-pay | Admitting: Internal Medicine

## 2016-04-02 DIAGNOSIS — M898X8 Other specified disorders of bone, other site: Secondary | ICD-10-CM | POA: Insufficient documentation

## 2016-04-03 ENCOUNTER — Telehealth: Payer: Self-pay | Admitting: Family

## 2016-04-03 NOTE — Telephone Encounter (Signed)
Patient called regarding ultrasound results and if they came back yet from yesterday. Thank You!  Call pt @ [phone] (M)  AM

## 2016-04-03 NOTE — Telephone Encounter (Signed)
Please advise results? 

## 2016-04-03 NOTE — Telephone Encounter (Signed)
Please advise, thanks.

## 2016-04-23 ENCOUNTER — Encounter: Payer: Self-pay | Admitting: Family

## 2018-11-03 ENCOUNTER — Telehealth: Payer: Self-pay

## 2018-11-03 NOTE — Telephone Encounter (Signed)
LVM to please schedule visit with Rennie Plowman for Follow Up/Physical appointment.

## 2020-10-11 ENCOUNTER — Other Ambulatory Visit: Payer: Self-pay | Admitting: Student

## 2020-10-11 DIAGNOSIS — R590 Localized enlarged lymph nodes: Secondary | ICD-10-CM

## 2020-10-11 DIAGNOSIS — R222 Localized swelling, mass and lump, trunk: Secondary | ICD-10-CM

## 2020-10-12 ENCOUNTER — Other Ambulatory Visit: Payer: Self-pay

## 2020-10-12 ENCOUNTER — Ambulatory Visit
Admission: RE | Admit: 2020-10-12 | Discharge: 2020-10-12 | Disposition: A | Discharge status: Home / Self Care | Payer: Managed Care, Other (non HMO) | Source: Ambulatory Visit | Attending: Student | Admitting: Student

## 2020-10-12 DIAGNOSIS — R222 Localized swelling, mass and lump, trunk: Secondary | ICD-10-CM | POA: Diagnosis not present

## 2020-10-12 DIAGNOSIS — R590 Localized enlarged lymph nodes: Secondary | ICD-10-CM | POA: Diagnosis present

## 2020-11-05 ENCOUNTER — Other Ambulatory Visit (HOSPITAL_COMMUNITY): Payer: Self-pay | Admitting: Physician Assistant

## 2020-11-05 ENCOUNTER — Other Ambulatory Visit: Payer: Self-pay | Admitting: Physician Assistant

## 2020-11-05 DIAGNOSIS — R200 Anesthesia of skin: Secondary | ICD-10-CM

## 2020-11-05 DIAGNOSIS — R202 Paresthesia of skin: Secondary | ICD-10-CM

## 2020-11-13 ENCOUNTER — Ambulatory Visit: Payer: Managed Care, Other (non HMO)

## 2020-11-21 ENCOUNTER — Ambulatory Visit: Payer: Managed Care, Other (non HMO)

## 2020-11-27 ENCOUNTER — Other Ambulatory Visit: Payer: Self-pay

## 2020-11-27 ENCOUNTER — Ambulatory Visit
Admission: RE | Admit: 2020-11-27 | Discharge: 2020-11-27 | Disposition: A | Discharge status: Home / Self Care | Payer: Managed Care, Other (non HMO) | Source: Ambulatory Visit | Attending: Physician Assistant | Admitting: Physician Assistant

## 2020-11-27 DIAGNOSIS — R202 Paresthesia of skin: Secondary | ICD-10-CM

## 2020-11-27 DIAGNOSIS — R200 Anesthesia of skin: Secondary | ICD-10-CM | POA: Diagnosis present

## 2020-11-28 MED ORDER — GADOBUTROL 1 MMOL/ML IV SOLN
5.0000 mL | Freq: Once | INTRAVENOUS | Status: AC | PRN
  Administered 2020-11-28: 5 mL via INTRAVENOUS

## 2022-10-10 IMAGING — MR MR HEAD WO/W CM
13 series · 48 of 48 positions shown · IV contrast (gadavist)
Comparison: None.

CLINICAL DATA: Numbness of the feet, right more commonly than left.

EXAM:
MRI HEAD WITHOUT AND WITH CONTRAST
TECHNIQUE: Multiplanar, multiecho pulse sequences of the brain and surrounding
structures were obtained without and with intravenous contrast.
CONTRAST:  5mL GADAVIST GADOBUTROL 1 MMOL/ML IV SOLN

[Series 5: ax dwi_tracew · axial · 3.0mm · 0.65mm/px · z∈[-87,+67]mm · 3 of 48 slices shown]
[im 1/48]
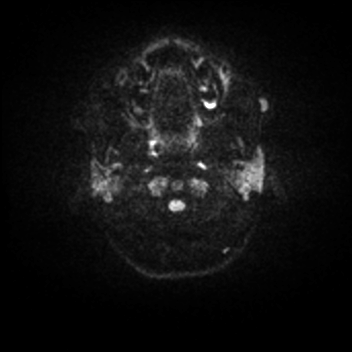
[im 24/48]
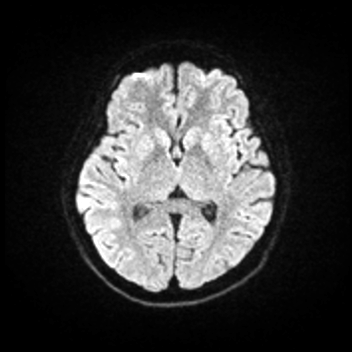
[im 48/48]
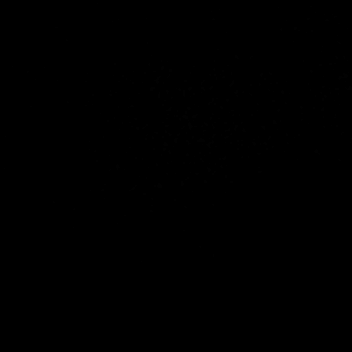

[Series 6: ax dwi_adc · axial · 3.0mm · 0.65mm/px · z∈[-87,+60]mm · 3 of 46 slices shown]
[im 1/46]
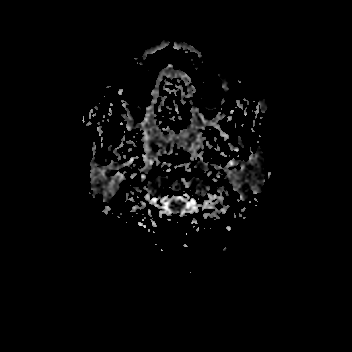
[im 23/46]
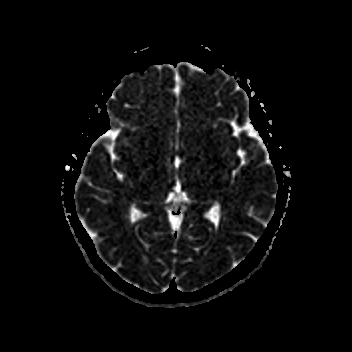
[im 46/46]
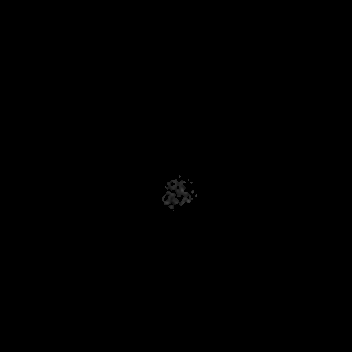

[Series 7: cor dwi_tracew · coronal · 5.0mm · 0.65mm/px · 2 of 36 slices shown]
[im 1/36]
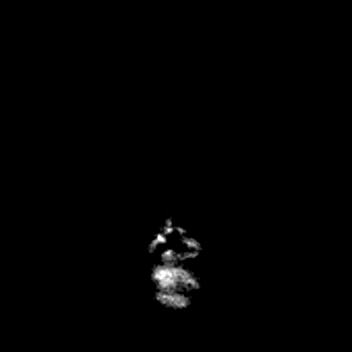
[im 36/36]
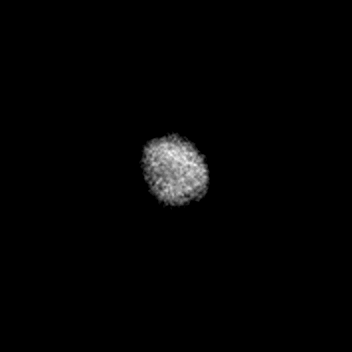

[Series 8: cor dwi_adc · coronal · 5.0mm · 0.65mm/px · 2 of 36 slices shown]
[im 1/36]
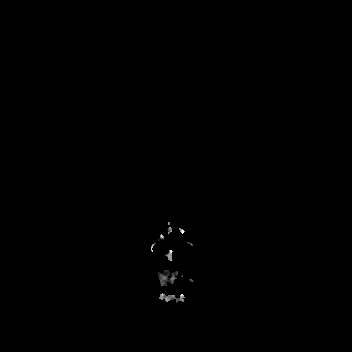
[im 36/36]
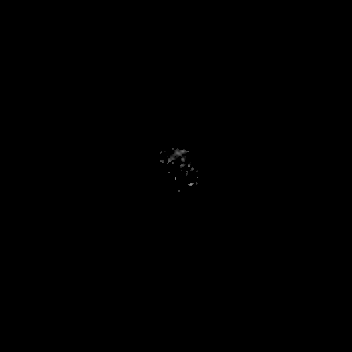

[Series 9: T1 · sagittal · 5.0mm · 0.62mm/px · 1 of 24 slices shown (1 of 2)]
[im 1/24]
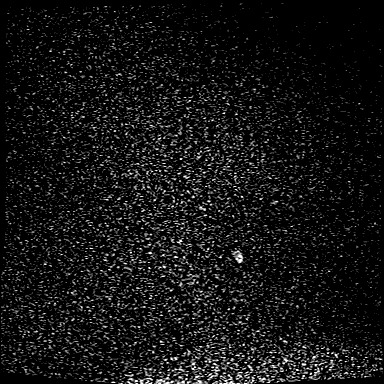

[Series 10: T2 · axial · 5.0mm · 0.53mm/px · z∈[-81,+62]mm · 2 of 25 slices shown]
[im 1/25]
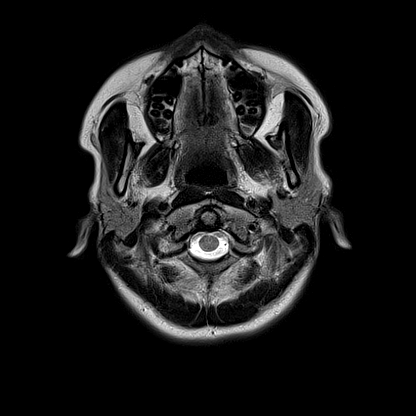
[im 25/25]
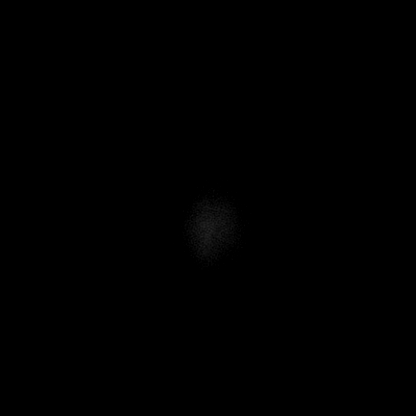

[Series 12: pha_images · axial · 3.0mm · 0.90mm/px · z∈[-98,+78]mm · 3 of 56 slices shown]
[im 1/56]
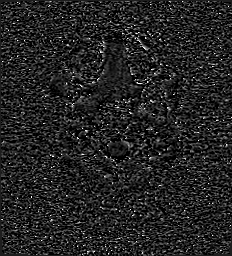
[im 28/56]
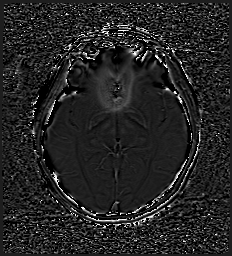
[im 56/56]
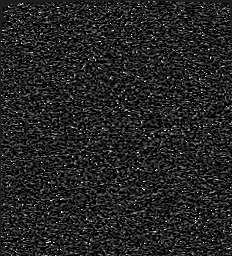

[Series 13: swi_images · axial · 3.0mm · 0.90mm/px · z∈[-98,+78]mm · 4 of 60 slices shown]
[im 1/60]
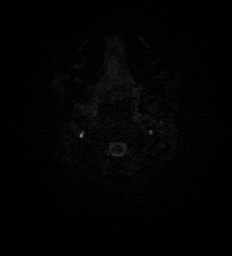
[im 20/60]
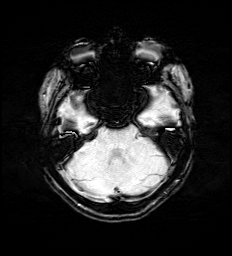
[im 40/60]
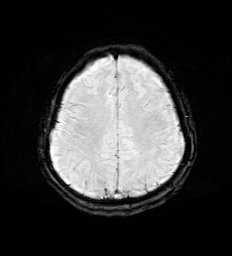
[im 60/60]
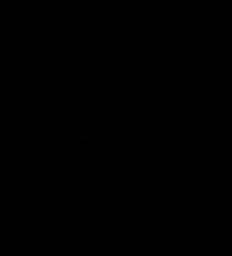

[Series 15: FLAIR · axial · 3.0mm · 0.53mm/px · z∈[-90,+71]mm · 3 of 55 slices shown]
[im 1/55]
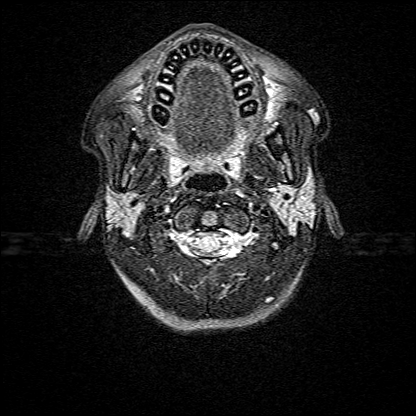
[im 28/55]
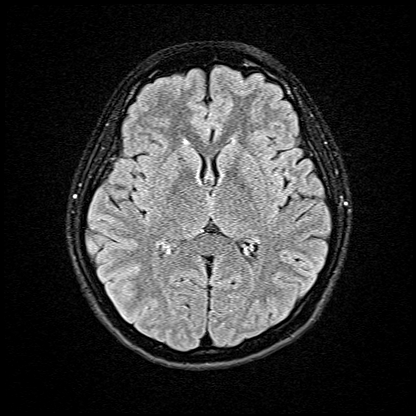
[im 55/55]
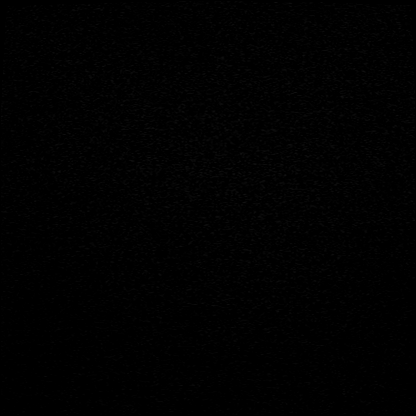

[Series 16: T1 · axial · 1.0mm · 0.98mm/px · z∈[-98,+75]mm · 10 of 172 slices shown (2 of 2)]
[im 1/172]
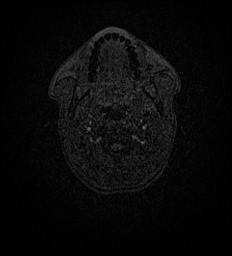
[im 20/172]
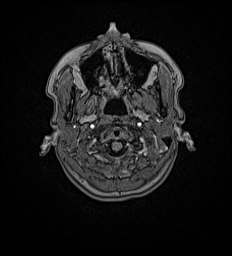
[im 39/172]
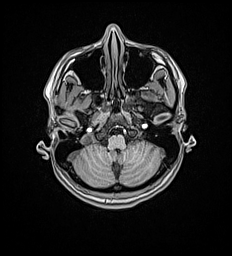
[im 58/172]
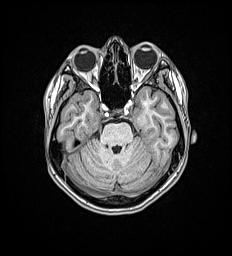
[im 77/172]
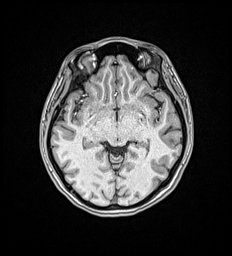
[im 96/172]
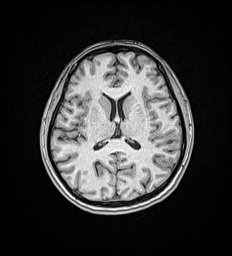
[im 115/172]
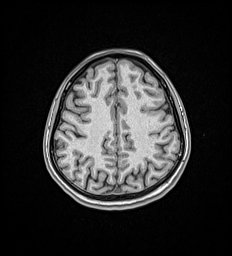
[im 134/172]
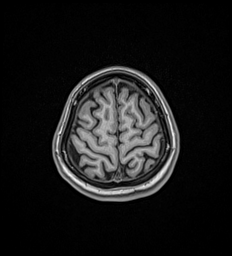
[im 153/172]
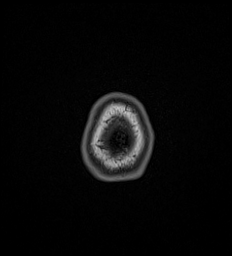
[im 172/172]
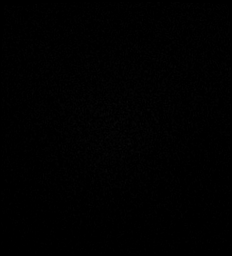

[Series 17: T2 post-contrast · coronal · 5.0mm · 0.57mm/px · 2 of 29 slices shown]
[im 1/29]
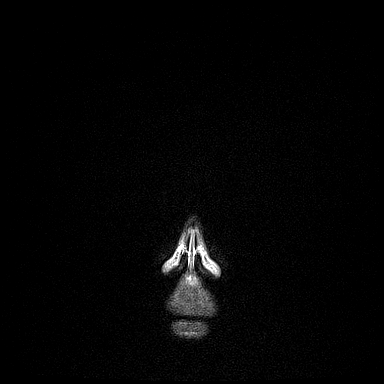
[im 29/29]
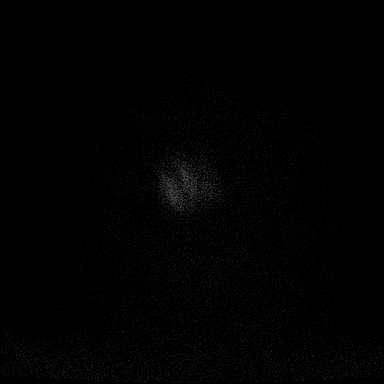

[Series 18: T1 post-contrast · coronal · 5.0mm · 0.57mm/px · 2 of 29 slices shown (1 of 2)]
[im 1/29]
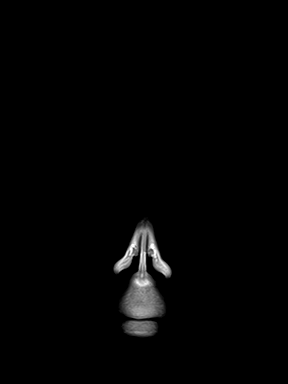
[im 29/29]
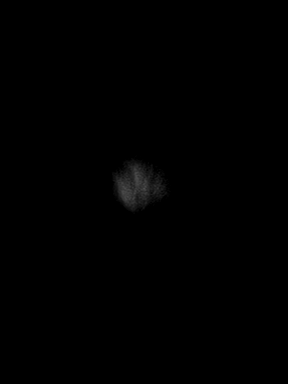

[Series 19: T1 post-contrast · axial · 1.0mm · 0.98mm/px · z∈[-98,+75]mm · 11 of 174 slices shown (2 of 2)]
[im 1/174]
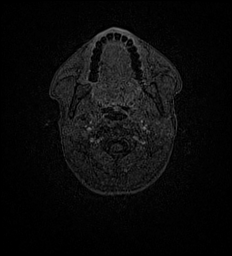
[im 18/174]
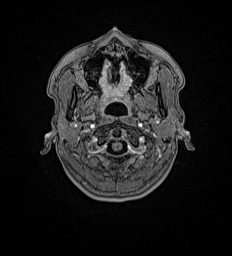
[im 35/174]
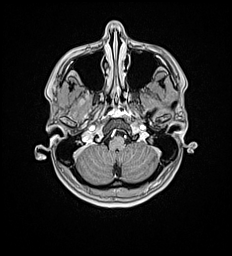
[im 52/174]
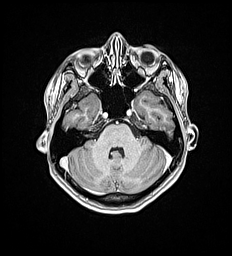
[im 70/174]
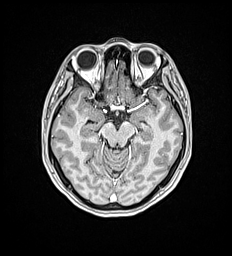
[im 87/174]
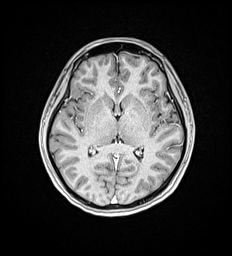
[im 104/174]
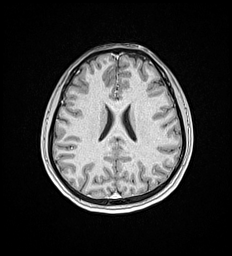
[im 122/174]
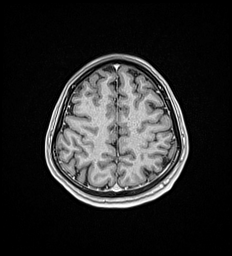
[im 139/174]
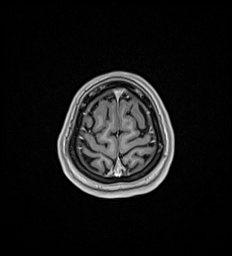
[im 156/174]
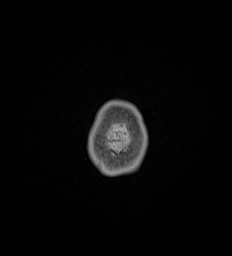
[im 174/174]
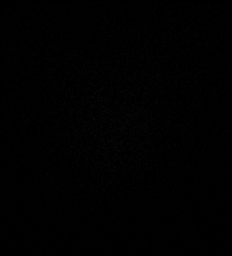

[48 of 48 positions shown; findings below may reference images not displayed]

FINDINGS: Brain: The brain has a normal appearance without evidence of
malformation, atrophy, old or acute small or large vessel
infarction, mass lesion, hemorrhage, hydrocephalus or extra-axial
collection. No sign of demyelinating disease. After contrast
administration, no abnormal enhancement occurs.

Vascular: Major vessels at the base of the brain show flow. Venous
sinuses appear patent.

Skull and upper cervical spine: Normal.

Sinuses/Orbits: Clear/normal.

Other: None significant.
IMPRESSION: Normal examination. No abnormality of the brain to explain the
clinical presentation. No sign of demyelinating disease.
